# Patient Record
Sex: Male | Born: 1990 | Race: Black or African American | Hispanic: No | Marital: Married | Smoking: Never smoker
Health system: Southern US, Community
[De-identification: ages and names within clinical notes are randomized; demographics above are authoritative.]

## PROBLEM LIST (undated history)

## (undated) ENCOUNTER — Ambulatory Visit (HOSPITAL_COMMUNITY): Admission: EM | Payer: Self-pay | Source: Home / Self Care

---

## 2017-12-22 ENCOUNTER — Other Ambulatory Visit: Payer: Self-pay

## 2017-12-22 ENCOUNTER — Emergency Department (HOSPITAL_COMMUNITY): Payer: BLUE CROSS/BLUE SHIELD

## 2017-12-22 ENCOUNTER — Emergency Department (HOSPITAL_COMMUNITY)
Admission: EM | Admit: 2017-12-22 | Discharge: 2017-12-22 | Disposition: A | Discharge status: Home / Self Care | Payer: BLUE CROSS/BLUE SHIELD | Attending: Emergency Medicine | Admitting: Emergency Medicine

## 2017-12-22 ENCOUNTER — Encounter (HOSPITAL_COMMUNITY): Payer: Self-pay | Admitting: Emergency Medicine

## 2017-12-22 DIAGNOSIS — G44209 Tension-type headache, unspecified, not intractable: Secondary | ICD-10-CM

## 2017-12-22 DIAGNOSIS — G43909 Migraine, unspecified, not intractable, without status migrainosus: Secondary | ICD-10-CM | POA: Diagnosis present

## 2017-12-22 LAB — GROUP A STREP BY PCR: Group A Strep by PCR: NOT DETECTED

## 2017-12-22 MED ORDER — METOCLOPRAMIDE HCL 5 MG/ML IJ SOLN
INTRAMUSCULAR | Status: AC
  Filled 2017-12-22: qty 2

## 2017-12-22 MED ORDER — IBUPROFEN 600 MG PO TABS: 600.0000 mg | ORAL_TABLET | Freq: Four times a day (QID) | ORAL | 0 refills | Status: DC | PRN

## 2017-12-22 MED ORDER — METHOCARBAMOL 500 MG PO TABS: 500.0000 mg | ORAL_TABLET | Freq: Two times a day (BID) | ORAL | 0 refills | Status: DC

## 2017-12-22 MED ORDER — KETOROLAC TROMETHAMINE 30 MG/ML IJ SOLN: 30.0000 mg | Freq: Once | INTRAMUSCULAR | Status: DC

## 2017-12-22 MED ORDER — METOCLOPRAMIDE HCL 5 MG/ML IJ SOLN: 10.0000 mg | Freq: Once | INTRAMUSCULAR | Status: DC

## 2017-12-22 MED ORDER — SODIUM CHLORIDE 0.9 % IV BOLUS: 1000.0000 mL | Freq: Once | INTRAVENOUS | Status: DC

## 2017-12-22 MED ORDER — METOCLOPRAMIDE HCL 10 MG PO TABS
10.0000 mg | ORAL_TABLET | Freq: Once | ORAL | Status: AC
  Administered 2017-12-22: 10 mg via ORAL
  Filled 2017-12-22: qty 1

## 2017-12-22 MED ORDER — KETOROLAC TROMETHAMINE 60 MG/2ML IM SOLN: 30.0000 mg | Freq: Once | INTRAMUSCULAR | Status: DC

## 2017-12-22 MED ORDER — KETOROLAC TROMETHAMINE 30 MG/ML IJ SOLN
INTRAMUSCULAR | Status: AC
  Filled 2017-12-22: qty 1

## 2017-12-22 NOTE — ED Notes (Signed)
Pt refused IV  

## 2017-12-22 NOTE — ED Triage Notes (Signed)
Pt reports migraine since Friday and has not resolved since then. Pt denies any nausea or vomiting.

## 2017-12-22 NOTE — Discharge Instructions (Signed)
Take ibuprofen as prescribed, as needed for your headache and neck pain.  Take Robaxin twice daily as needed for muscle pain or spasms.  Do not drive or operate machinery while taking this medication.  Use ice and heat alternating 20 minutes on, 20 minutes off.  Attempt the stretches that we discussed a few times daily.  Please return the emergency department if you develop any new or worsening symptoms.

## 2017-12-22 NOTE — ED Provider Notes (Signed)
[facility]   CSN: [id number] Arrival date & time: 12/22/17  0009     History   Chief Complaint Chief Complaint  Patient presents with  . Migraine    HPI Jeremy Mccarthy is a 27 y.o. male who is previously healthy who presents with a 3-day history of intermittent headache.  He describes it as temporal and sharp and stabbing.  He reports some pain in his neck as well.  He denies any injury, but does lift weights.  He reports he has had some nasal congestion at home and had chills one day, but no fever.  His chills are resolved.  He took ibuprofen and Tylenol at home without significant relief.  He has had some photosensitivity.  He denies any nausea or vomiting, chest pain, shortness of breath, abdominal pain, numbness or tingling.  HPI  History reviewed. No pertinent past medical history.  There are no active problems to display for this patient.   History reviewed. No pertinent surgical history.      Home Medications    Prior to Admission medications   Medication Sig Start Date End Date Taking? Authorizing Provider  acetaminophen (TYLENOL) 500 MG tablet Take 500 mg by mouth every 6 (six) hours as needed for headache.   Yes [provider]  ibuprofen (ADVIL,MOTRIN) 600 MG tablet Take 1 tablet (600 mg total) by mouth every 6 (six) hours as needed. 12/22/17   Boleslaw Borghi, Waylan Boga, PA-C  methocarbamol (ROBAXIN) 500 MG tablet Take 1 tablet (500 mg total) by mouth 2 (two) times daily. 12/22/17   Emi Holes, PA-C    Family History History reviewed. No pertinent family history.  Social History Social History   Tobacco Use  . Smoking status: Never Smoker  . Smokeless tobacco: Never Used  Substance Use Topics  . Alcohol use: Never    Frequency: Never  . Drug use: Never     Allergies   Patient has no known allergies.   Review of Systems Review of Systems  Constitutional: Positive for chills (resolved, only  1 day). Negative for fever.  HENT: Positive for congestion and rhinorrhea. Negative for facial swelling and sore throat.   Respiratory: Negative for shortness of breath.   Cardiovascular: Negative for chest pain.  Gastrointestinal: Negative for abdominal pain, nausea and vomiting.  Genitourinary: Negative for dysuria.  Musculoskeletal: Positive for neck pain. Negative for back pain and neck stiffness.  Skin: Negative for rash and wound.  Neurological: Positive for headaches.  Psychiatric/Behavioral: The patient is not nervous/anxious.      Physical Exam Updated Vital Signs BP 127/77   Pulse 70   Temp 99.1 F (37.3 C) (Oral)   Resp 14   Ht 5\' 6"  (1.676 m)   Wt 63.5 kg (140 lb)   SpO2 100%   BMI 22.60 kg/m   Physical Exam  Constitutional: He appears well-developed and well-nourished. No distress.  HENT:  Head: Normocephalic and atraumatic.  Mouth/Throat: Oropharynx is clear and moist. No oropharyngeal exudate.  Eyes: Pupils are equal, round, and reactive to light. Conjunctivae and EOM are normal. Right eye exhibits no discharge. Left eye exhibits no discharge. No scleral icterus.  Neck: Normal range of motion. Neck supple. No thyromegaly present.  Patient with full range of motion to his neck without difficulty, he reports bilateral upper trapezius tenderness when he moves his neck down and is also reproduced on palpation No midline tenderness or pain  Cardiovascular: Normal rate, regular rhythm, normal  heart sounds and intact distal pulses. Exam reveals no gallop and no friction rub.  No murmur heard. Pulmonary/Chest: Effort normal and breath sounds normal. No stridor. No respiratory distress. He has no wheezes. He has no rales.  Abdominal: Soft. Bowel sounds are normal. He exhibits no distension. There is no tenderness. There is no rebound and no guarding.  Musculoskeletal: He exhibits no edema.  Lymphadenopathy:    He has no cervical adenopathy.  Neurological: He is alert.  Coordination normal.  CN 3-12 intact; normal sensation throughout; 5/5 strength in all 4 extremities; equal bilateral grip strength; no ataxia on finger-to-nose  Skin: Skin is warm and dry. No rash noted. He is not diaphoretic. No pallor.  Psychiatric: He has a normal mood and affect.  Nursing note and vitals reviewed.    ED Treatments / Results  Labs (all labs ordered are listed, but only abnormal results are displayed) Labs Reviewed  GROUP A STREP BY PCR    EKG None  Radiology Ct Head Wo Contrast  Result Date: 12/22/2017 CLINICAL DATA:  Migraine headache EXAM: CT HEAD WITHOUT CONTRAST TECHNIQUE: Contiguous axial images were obtained from the base of the skull through the vertex without intravenous contrast. COMPARISON:  None. FINDINGS: Brain: No evidence of acute infarction, hemorrhage, hydrocephalus, extra-axial collection or mass lesion/mass effect. Vascular: No hyperdense vessel or unexpected calcification. Skull: Normal. Negative for fracture or focal lesion. Sinuses/Orbits: The visualized paranasal sinuses are essentially clear. The mastoid air cells are unopacified. Other: None. IMPRESSION: Normal head CT. Electronically Signed   By: Charline BillsSriyesh  Krishnan M.D.   On: 12/22/2017 02:45    Procedures Procedures (including critical care time)  Medications Ordered in ED Medications  ketorolac (TORADOL) injection 30 mg (30 mg Intramuscular Refused 12/22/17 0335)  metoCLOPramide (REGLAN) tablet 10 mg (10 mg Oral Given 12/22/17 0334)     Initial Impression / Assessment and Plan / ED Course  I have reviewed the triage vital signs and the nursing notes.  Pertinent labs & imaging results that were available during my care of the patient were reviewed by me and considered in my medical decision making (see chart for details).     Patient with suspected tension headache.  Patient has full range of motion of his neck.  He is afebrile.  Normal neuro exam without focal deficits. Patient  offered headache cocktail in the ED, however he was afraid of needles and did not want an IV or IM medications.  Patient given Reglan instead.  He is now asymptomatic.  He is very concerned about his headaches considering no history and requested CT of the head which I feel is reasonable considering patient had no history of headaches.  CT head was negative.  Strep negative.  Patient advised to take ibuprofen and Robaxin at home as needed for muscle pain and spasms.  Discussed stretches and heat and ice therapy as well.  Strict return precautions discussed.  Patient understands and agrees with plan.  Patient vitals stable throughout ED course and discharged in satisfactory condition.  Final Clinical Impressions(s) / ED Diagnoses   Final diagnoses:  Tension headache    ED Discharge Orders        Ordered    ibuprofen (ADVIL,MOTRIN) 600 MG tablet  Every 6 hours PRN     12/22/17 0403    methocarbamol (ROBAXIN) 500 MG tablet  2 times daily     12/22/17 0403       Ascencion Coye, Waylan BogaAlexandra M, PA-C 12/22/17 0408    Rhunette CroftNanavati,  Ankit, MD 12/23/17 [phone]

## 2019-03-20 IMAGING — CT CT HEAD W/O CM
3 series · 16 of 47 positions shown, 19 images · non-contrast
Comparison: None.

CLINICAL DATA: Migraine headache

EXAM:
CT HEAD WITHOUT CONTRAST
TECHNIQUE: Contiguous axial images were obtained from the base of the skull
through the vertex without intravenous contrast.

[Series 3: head wo · axial · 0.47mm/px · z∈[+186,+321]mm · 10 of 33 slices shown, 13 images]
[im 3/33  brain]
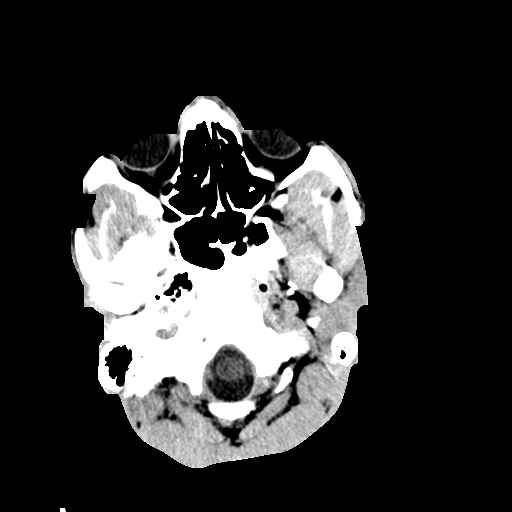
[im 3/33  bone]
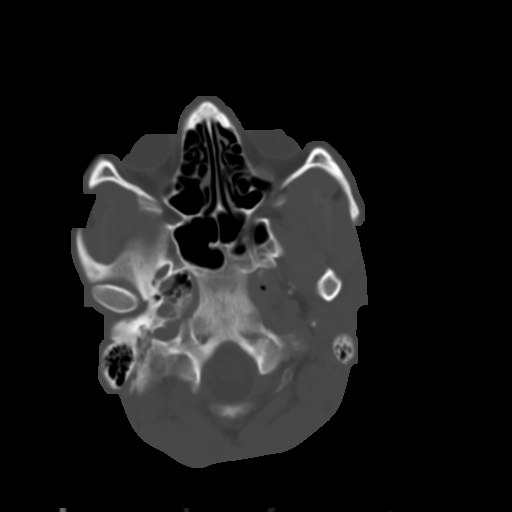
[im 6/33  brain]
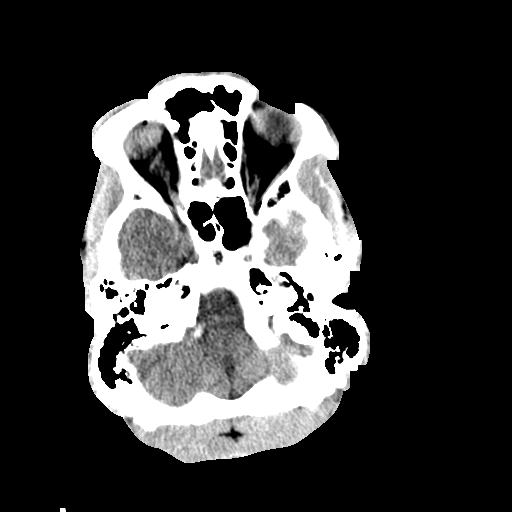
[im 9/33  brain]
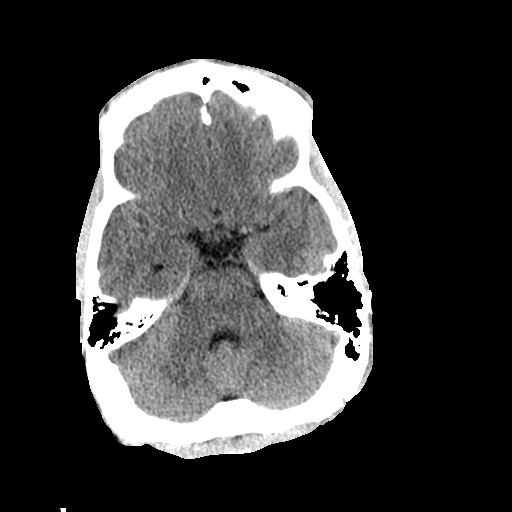
[im 12/33  brain]
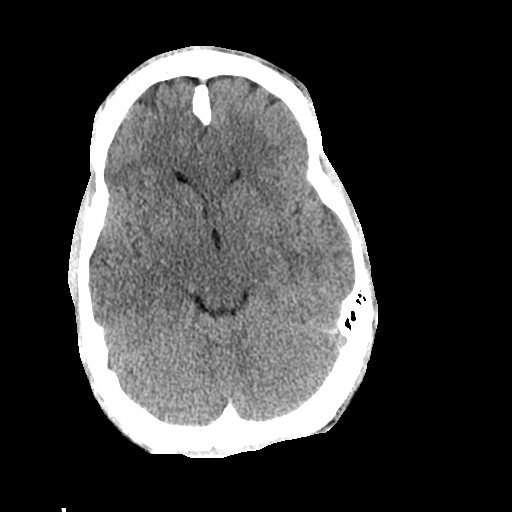
[im 15/33  brain]
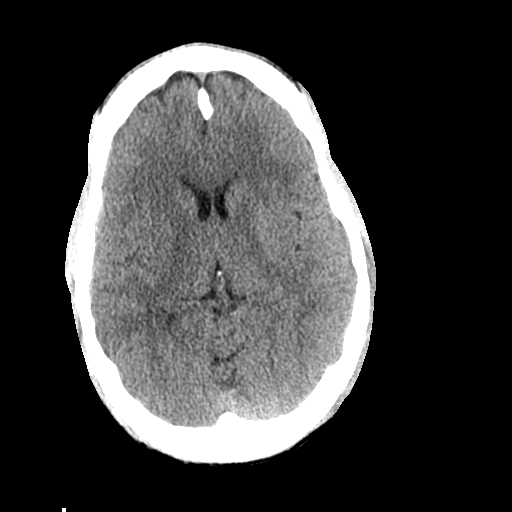
[im 15/33  bone]
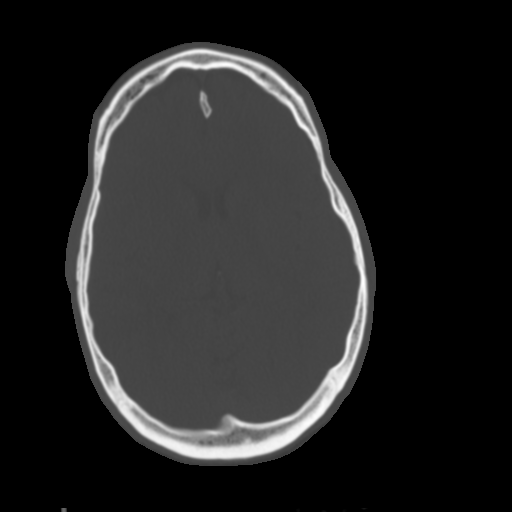
[im 18/33  brain]
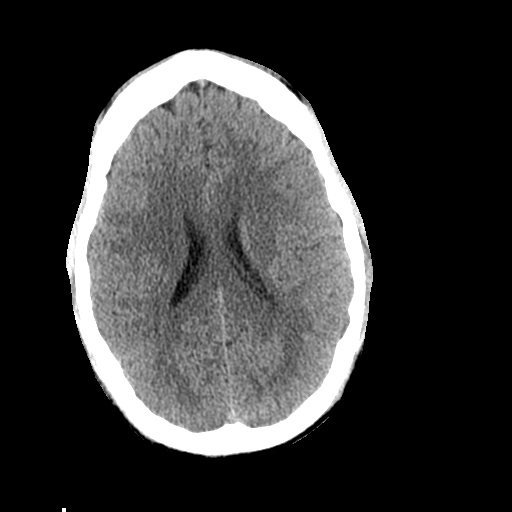
[im 21/33  brain]
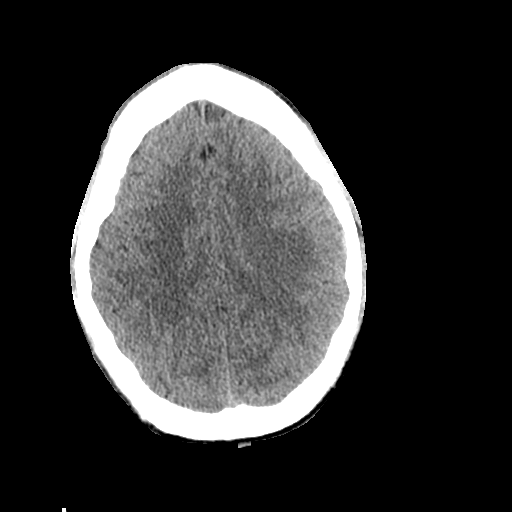
[im 25/33  brain]
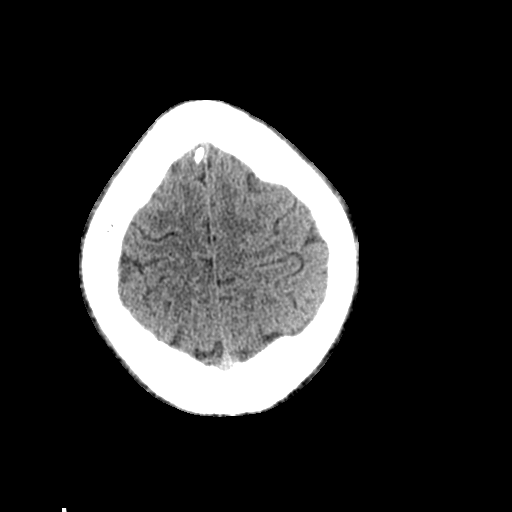
[im 27/33  brain]
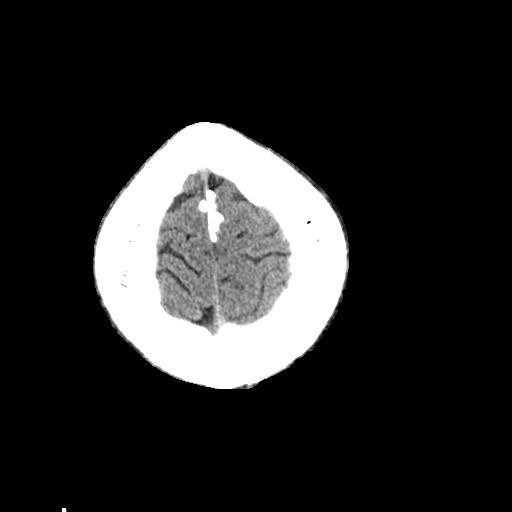
[im 27/33  bone]
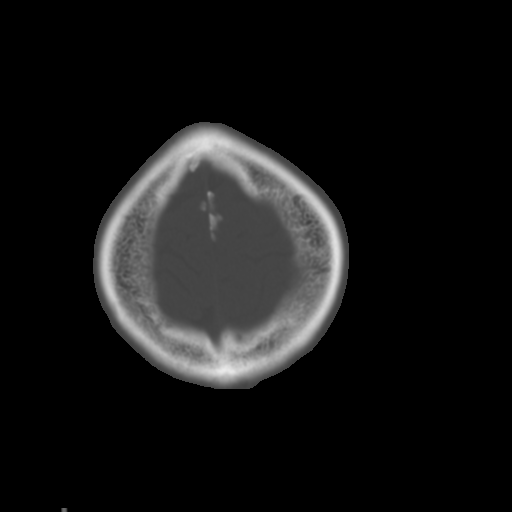
[im 30/33  brain]
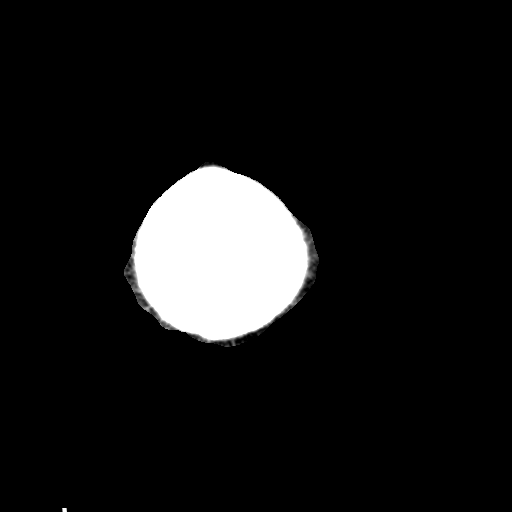

[Series 5: coronal soft tissue · coronal · 0.30mm/px · 3 of 64 slices shown]
[im 22/64  brain]
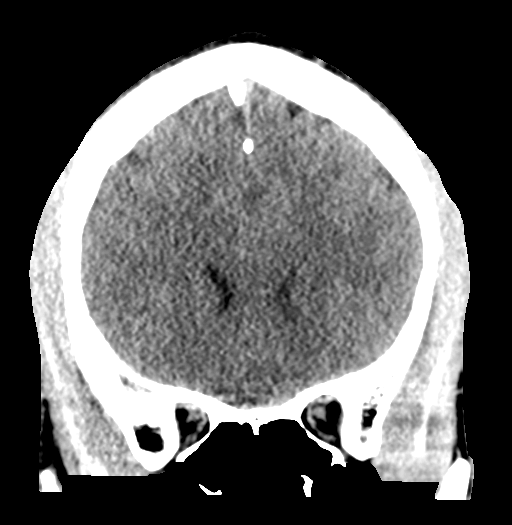
[im 29/64  brain]
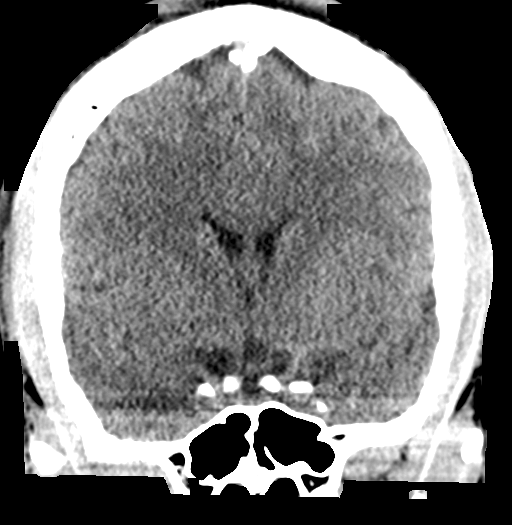
[im 36/64  brain]
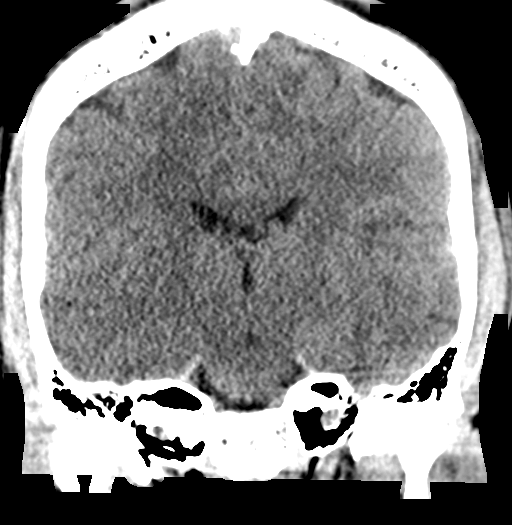

[Series 6: sagittal soft tissue · sagittal · 0.33mm/px · 3 of 50 slices shown]
[im 17/50  brain]
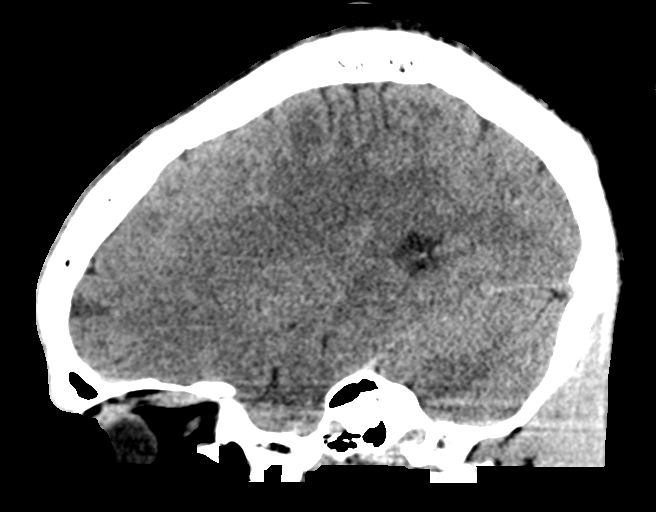
[im 25/50  brain]
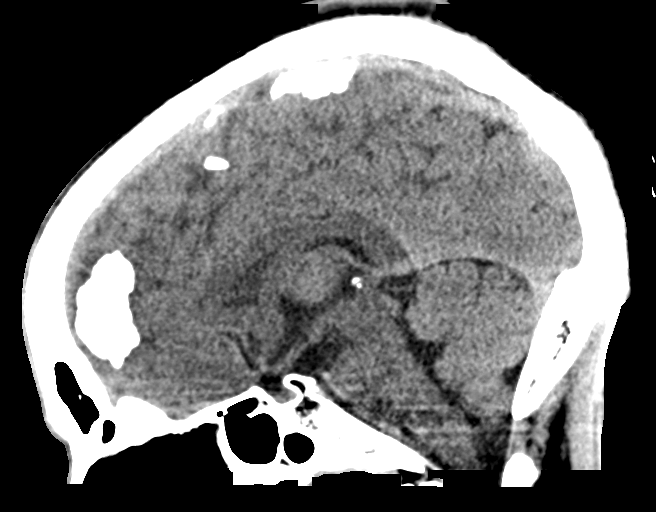
[im 33/50  brain]
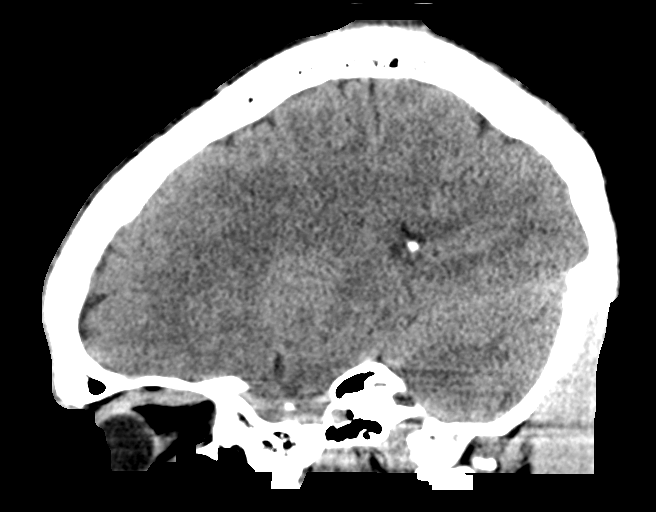

[16 of 47 positions shown; findings below may reference images not displayed]

FINDINGS: Brain: No evidence of acute infarction, hemorrhage, hydrocephalus,
extra-axial collection or mass lesion/mass effect.

Vascular: No hyperdense vessel or unexpected calcification.

Skull: Normal. Negative for fracture or focal lesion.

Sinuses/Orbits: The visualized paranasal sinuses are essentially
clear. The mastoid air cells are unopacified.

Other: None.
IMPRESSION: Normal head CT.

## 2019-09-02 ENCOUNTER — Encounter (HOSPITAL_COMMUNITY): Payer: Self-pay

## 2019-09-02 ENCOUNTER — Ambulatory Visit (HOSPITAL_COMMUNITY)
Admission: EM | Admit: 2019-09-02 | Discharge: 2019-09-02 | Disposition: A | Discharge status: Home / Self Care | Payer: Self-pay

## 2019-09-02 ENCOUNTER — Other Ambulatory Visit: Payer: Self-pay

## 2019-09-02 DIAGNOSIS — M79645 Pain in left finger(s): Secondary | ICD-10-CM

## 2019-09-02 MED ORDER — DICLOFENAC SODIUM 1 % EX GEL: 4.0000 g | Freq: Four times a day (QID) | CUTANEOUS | 0 refills | Status: DC

## 2019-09-02 NOTE — ED Provider Notes (Signed)
[facility]    CSN: [id number] Arrival date & time: 09/02/19  1927      History   Chief Complaint Chief Complaint  Patient presents with  . Hand Pain    HPI Jeremy Mccarthy is a 29 y.o. male.   Patient reports for evaluation of pain in his left middle finger.  Reports is been present for several years.  He reports he believes he injured it playing baseball sliding a base of years ago.  Never had evaluated the time and mostly healed.  He reports it gives him issues time to time.  Reports the pain is at the first knuckle in the middle finger on the left hand.  Denies significant swelling, redness or rash.  Reports he can use the hand regularly however he noted today when he was carrying a bag that he had pain on the inside of the finger.  Denies recent trauma.  Denies other joint pain.     History reviewed. No pertinent past medical history.  There are no problems to display for this patient.   History reviewed. No pertinent surgical history.     Home Medications    Prior to Admission medications   Medication Sig Start Date End Date Taking? Authorizing Provider  Multiple Vitamin (MULTIVITAMIN WITH MINERALS) TABS tablet Take 1 tablet by mouth daily.   Yes [provider]  acetaminophen (TYLENOL) 500 MG tablet Take 500 mg by mouth every 6 (six) hours as needed for headache.    [provider]  diclofenac Sodium (VOLTAREN) 1 % GEL Apply 4 g topically 4 (four) times daily. 09/02/19   Jeremy Mccarthy, Jeremy Speak, PA-C  ibuprofen (ADVIL,MOTRIN) 600 MG tablet Take 1 tablet (600 mg total) by mouth every 6 (six) hours as needed. 12/22/17   Law, Waylan Boga, PA-C  methocarbamol (ROBAXIN) 500 MG tablet Take 1 tablet (500 mg total) by mouth 2 (two) times daily. 12/22/17   Jeremy Holes, PA-C    Family History Family History  Problem Relation Age of Onset  . Diabetes Mother     Social History Social History   Tobacco Use  . Smoking status: Never Smoker  .  Smokeless tobacco: Never Used  Substance Use Topics  . Alcohol use: Never  . Drug use: Never     Allergies   Patient has no known allergies.   Review of Systems Review of Systems   Physical Exam Triage Vital Signs ED Triage Vitals [09/02/19 2031]  Enc Vitals Group     BP (!) 117/54     Pulse Rate 67     Resp 16     Temp 98.8 F (37.1 C)     Temp Source Oral     SpO2 100 %     Weight 140 lb (63.5 kg)     Height 5\' 6"  (1.676 m)     Head Circumference      Peak Flow      Pain Score 5     Pain Loc      Pain Edu?      Excl. in GC?    No data found.  Updated Vital Signs BP (!) 117/54   Pulse 67   Temp 98.8 F (37.1 C) (Oral)   Resp 16   Ht 5\' 6"  (1.676 m)   Wt 140 lb (63.5 kg)   SpO2 100%   BMI 22.60 kg/m   Visual Acuity Right Eye Distance:   Left Eye Distance:   Bilateral Distance:  Right Eye Near:   Left Eye Near:    Bilateral Near:     Physical Exam Constitutional:      Appearance: Normal appearance.  Musculoskeletal:     Comments: Left hand: Third digit without erythema, swelling, crepitus.  Patient has full range of motion.  Grip strength 5/5.  Joints not swollen.  Neurological:     Mental Status: He is alert.      UC Treatments / Results  Labs (all labs ordered are listed, but only abnormal results are displayed) Labs Reviewed - No data to display  EKG   Radiology No results found.  Procedures Procedures (including critical care time)  Medications Ordered in UC Medications - No data to display  Initial Impression / Assessment and Plan / UC Course  I have reviewed the triage vital signs and the nursing notes.  Pertinent labs & imaging results that were available during my care of the patient were reviewed by me and considered in my medical decision making (see chart for details).     #Finger pain 29 year old male presenting with chronic left ring finger pain.  No obvious sign of injury or inflammatory process.  Possibly  early osteoarthritis secondary to previous injuries.  Discussed this with patient that there is no dictation for imaging today.  Recommended starting Voltaren gel 4 times a day as needed in order to avoid systemic NSAIDs.  Patient verbalizes agreement with this plan and will follow up with primary care if continued to cause issues. Final Clinical Impressions(s) / UC Diagnoses   Final diagnoses:  Finger pain, left     Discharge Instructions     Try the voltaren, 4 times a day .  If it continues to bother you, add ibuprofen 400mg  up to every 6 hours  Please follow up with primary care for further evaluation.        ED Prescriptions    Medication Sig Dispense Auth. Provider   diclofenac Sodium (VOLTAREN) 1 % GEL Apply 4 g topically 4 (four) times daily. 100 g Jeremy Mccarthy, Jeremy Beards, PA-C     PDMP not reviewed this encounter.   Purnell Shoemaker, PA-C 09/03/19 1729

## 2019-09-02 NOTE — ED Triage Notes (Signed)
Pt states his 3rd left digit has been hurting for years now, but got worse within the past month. Pt states he doesn't remember injuring finger. No edema noted. Pt is able to fully bend finger.

## 2019-09-02 NOTE — Discharge Instructions (Addendum)
Try the voltaren, 4 times a day .  If it continues to bother you, add ibuprofen 400mg  up to every 6 hours  Please follow up with primary care for further evaluation.

## 2022-01-28 ENCOUNTER — Encounter (HOSPITAL_COMMUNITY): Payer: Self-pay | Admitting: Emergency Medicine

## 2022-01-28 ENCOUNTER — Other Ambulatory Visit: Payer: Self-pay

## 2022-01-28 ENCOUNTER — Emergency Department (HOSPITAL_COMMUNITY)
Admission: EM | Admit: 2022-01-28 | Discharge: 2022-01-29 | Disposition: A | Discharge status: Home / Self Care | Payer: 59 | Attending: Emergency Medicine | Admitting: Emergency Medicine

## 2022-01-28 DIAGNOSIS — R6884 Jaw pain: Secondary | ICD-10-CM | POA: Diagnosis present

## 2022-01-28 DIAGNOSIS — Z5321 Procedure and treatment not carried out due to patient leaving prior to being seen by health care provider: Secondary | ICD-10-CM | POA: Diagnosis not present

## 2022-01-28 DIAGNOSIS — K0889 Other specified disorders of teeth and supporting structures: Secondary | ICD-10-CM | POA: Diagnosis not present

## 2022-01-28 NOTE — ED Triage Notes (Signed)
Patient reports mouth pain for several days , denies injury , airway intact/ respirations unlabored .

## 2022-01-29 ENCOUNTER — Emergency Department (HOSPITAL_COMMUNITY)
Admission: EM | Admit: 2022-01-29 | Discharge: 2022-01-29 | Disposition: A | Discharge status: Home / Self Care | Payer: 59 | Attending: Emergency Medicine | Admitting: Emergency Medicine

## 2022-01-29 ENCOUNTER — Other Ambulatory Visit: Payer: Self-pay

## 2022-01-29 ENCOUNTER — Encounter (HOSPITAL_COMMUNITY): Payer: Self-pay

## 2022-01-29 DIAGNOSIS — K029 Dental caries, unspecified: Secondary | ICD-10-CM | POA: Insufficient documentation

## 2022-01-29 DIAGNOSIS — K0889 Other specified disorders of teeth and supporting structures: Secondary | ICD-10-CM | POA: Diagnosis present

## 2022-01-29 MED ORDER — NAPROXEN 500 MG PO TABS: 500.0000 mg | ORAL_TABLET | Freq: Two times a day (BID) | ORAL | 0 refills | Status: DC

## 2022-01-29 MED ORDER — AMOXICILLIN 500 MG PO CAPS
500.0000 mg | ORAL_CAPSULE | Freq: Once | ORAL | Status: AC
  Administered 2022-01-29: 500 mg via ORAL
  Filled 2022-01-29: qty 1

## 2022-01-29 MED ORDER — AMOXICILLIN 500 MG PO CAPS: 500.0000 mg | ORAL_CAPSULE | Freq: Three times a day (TID) | ORAL | 0 refills | Status: AC

## 2022-01-29 MED ORDER — NAPROXEN 500 MG PO TABS
500.0000 mg | ORAL_TABLET | Freq: Once | ORAL | Status: AC
  Administered 2022-01-29: 500 mg via ORAL
  Filled 2022-01-29: qty 1

## 2022-01-29 NOTE — ED Triage Notes (Addendum)
Pt states that he has a toothache to his top and bottom teeth.(x 2 weeks) Pt states that he thinks that he has a mouth infection and this is what is causing a headache. Pt states that he has a dentist appt tomorrow but the pain is unbearable.

## 2022-01-29 NOTE — ED Provider Notes (Signed)
[facility] Provider Note   CSN: [id number] Arrival date & time: 01/29/22  2002    History  Chief Complaint  Patient presents with   Dental Pain    Jeremy Mccarthy is a 31 y.o. male with no significant past medical history here for evaluation of dental pain. Began 2 weeks ago. Primarily located to right lower dentition. Worse with eating or drink hot or cold food. Took Ibuprofen yesterday which helped. Has dental appointment tomorrow. Something similar and was started on abx which helped. No fever, facial swelling, neck pain, stiffness. Tolerating PO intake without difficulty.  HPI     Home Medications Prior to Admission medications   Medication Sig Start Date End Date Taking? Authorizing Provider  amoxicillin (AMOXIL) 500 MG capsule Take 1 capsule (500 mg total) by mouth 3 (three) times daily for 7 days. 01/29/22 02/05/22 Yes Zaia Carre A, PA-C  naproxen (NAPROSYN) 500 MG tablet Take 1 tablet (500 mg total) by mouth 2 (two) times daily. 01/29/22  Yes Aaralyn Kil A, PA-C  acetaminophen (TYLENOL) 500 MG tablet Take 500 mg by mouth every 6 (six) hours as needed for headache.    [provider]  diclofenac Sodium (VOLTAREN) 1 % GEL Apply 4 g topically 4 (four) times daily. 09/02/19   Darr, Gerilyn Pilgrim, PA-C  ibuprofen (ADVIL,MOTRIN) 600 MG tablet Take 1 tablet (600 mg total) by mouth every 6 (six) hours as needed. 12/22/17   Law, Waylan Boga, PA-C  methocarbamol (ROBAXIN) 500 MG tablet Take 1 tablet (500 mg total) by mouth 2 (two) times daily. 12/22/17   Emi Holes, PA-C  Multiple Vitamin (MULTIVITAMIN WITH MINERALS) TABS tablet Take 1 tablet by mouth daily.    [provider]      Allergies    Patient has no known allergies.    Review of Systems   Review of Systems  Constitutional: Negative.   HENT:  Positive for dental problem.   Respiratory: Negative.    Cardiovascular: Negative.   Gastrointestinal: Negative.    Genitourinary: Negative.   Musculoskeletal: Negative.   Skin: Negative.   Neurological: Negative.   All other systems reviewed and are negative.   Physical Exam Updated Vital Signs BP 124/88 (BP Location: Right Arm)   Pulse 62   Temp 98.1 F (36.7 C) (Oral)   Resp 14   Ht 5\' 6"  (1.676 m)   Wt 62.1 kg   SpO2 100%   BMI 22.11 kg/m  Physical Exam Vitals and nursing note reviewed.  Constitutional:      General: He is not in acute distress.    Appearance: He is well-developed. He is not ill-appearing or diaphoretic.  HENT:     Head: Atraumatic.     Jaw: There is normal jaw occlusion.     Comments: No facial swelling, no drooling, dysphagia or trismus    Mouth/Throat:     Lips: Pink.     Dentition: Abnormal dentition. Does not have dentures. Dental tenderness and dental caries present. No gingival swelling or dental abscesses.     Palate: No mass and lesions.     Pharynx: Oropharynx is clear. Uvula midline.     Tonsils: No tonsillar exudate or tonsillar abscesses.     Comments: Poor dentition, some missing teeth, dental caries.  Sublingual area soft.  No obvious drainable drainable.  Eyes:     Pupils: Pupils are equal, round, and reactive to light.  Neck:     Trachea: Trachea and phonation normal.  Comments: Full ROM Cardiovascular:     Rate and Rhythm: Normal rate and regular rhythm.  Pulmonary:     Effort: Pulmonary effort is normal. No respiratory distress.  Abdominal:     General: There is no distension.     Palpations: Abdomen is soft.  Musculoskeletal:        General: Normal range of motion.     Cervical back: Full passive range of motion without pain, normal range of motion and neck supple.  Skin:    General: Skin is warm and dry.  Neurological:     General: No focal deficit present.     Mental Status: He is alert and oriented to person, place, and time.    ED Results / Procedures / Treatments   Labs (all labs ordered are listed, but only abnormal  results are displayed) Labs Reviewed - No data to display  EKG None  Radiology No results found.  Procedures Procedures    Medications Ordered in ED Medications  amoxicillin (AMOXIL) capsule 500 mg (has no administration in time range)  naproxen (NAPROSYN) tablet 500 mg (has no administration in time range)    ED Course/ Medical Decision Making/ A&P    31 year old here for evaluation of dental pain over the last 2 weeks.  His dental appointment tomorrow.  No recent trauma.  Pain worse with hot or cold liquids.  Took ibuprofen at home which helped with this pain.  He has no obvious facial swelling.  Low suspicion for Ludwig's angina, deep space infection.  Posterior pharynx clear.  Does have some dental caries, missing teeth however no obvious oral abscess.  Start on antibiotics, anti-inflammatory, encouraged him to keep his dental appointment for tomorrow, return for new worsening symptoms. Tolerating PO intake without difficulty.  The patient has been appropriately medically screened and/or stabilized in the ED. I have low suspicion for any other emergent medical condition which would require further screening, evaluation or treatment in the ED or require inpatient management.  Patient is hemodynamically stable and in no acute distress.  Patient able to ambulate in department prior to ED.  Evaluation does not show acute pathology that would require ongoing or additional emergent interventions while in the emergency department or further inpatient treatment.  I have discussed the diagnosis with the patient and answered all questions.  Pain is been managed while in the emergency department and patient has no further complaints prior to discharge.  Patient is comfortable with plan discussed in room and is stable for discharge at this time.  I have discussed strict return precautions for returning to the emergency department.  Patient was encouraged to follow-up with PCP/specialist refer to at  discharge.                           Medical Decision Making Amount and/or Complexity of Data Reviewed External Data Reviewed: notes.  Risk OTC drugs. Prescription drug management. Decision regarding hospitalization. Diagnosis or treatment significantly limited by social determinants of health.          Final Clinical Impression(s) / ED Diagnoses Final diagnoses:  Pain, dental    Rx / DC Orders ED Discharge Orders          Ordered    naproxen (NAPROSYN) 500 MG tablet  2 times daily        01/29/22 2113    amoxicillin (AMOXIL) 500 MG capsule  3 times daily        01/29/22  2113              Myalynn Lingle A, PA-C 01/29/22 2114    Pricilla Loveless, MD 02/01/22 [phone]

## 2022-01-29 NOTE — Discharge Instructions (Signed)
Take the medication as prescribed.  Follow-up with dentistry  Return for new or worsening symptoms

## 2022-01-29 NOTE — ED Notes (Signed)
This Clinical research associate obtained vitals on everyone in lobby. Patient was not present. No answer for vitals recheck either.

## 2022-12-08 ENCOUNTER — Encounter (HOSPITAL_COMMUNITY): Payer: Self-pay

## 2022-12-08 ENCOUNTER — Emergency Department (HOSPITAL_COMMUNITY)
Admission: EM | Admit: 2022-12-08 | Discharge: 2022-12-08 | Disposition: A | Discharge status: Home / Self Care | Payer: BC Managed Care – PPO | Attending: Emergency Medicine | Admitting: Emergency Medicine

## 2022-12-08 ENCOUNTER — Emergency Department (HOSPITAL_COMMUNITY): Payer: BC Managed Care – PPO

## 2022-12-08 ENCOUNTER — Other Ambulatory Visit: Payer: Self-pay

## 2022-12-08 DIAGNOSIS — M25512 Pain in left shoulder: Secondary | ICD-10-CM | POA: Diagnosis not present

## 2022-12-08 DIAGNOSIS — Z041 Encounter for examination and observation following transport accident: Secondary | ICD-10-CM | POA: Diagnosis not present

## 2022-12-08 DIAGNOSIS — M542 Cervicalgia: Secondary | ICD-10-CM | POA: Diagnosis not present

## 2022-12-08 DIAGNOSIS — Y92410 Unspecified street and highway as the place of occurrence of the external cause: Secondary | ICD-10-CM | POA: Diagnosis not present

## 2022-12-08 DIAGNOSIS — Z23 Encounter for immunization: Secondary | ICD-10-CM | POA: Insufficient documentation

## 2022-12-08 MED ORDER — IBUPROFEN 200 MG PO TABS
400.0000 mg | ORAL_TABLET | Freq: Once | ORAL | Status: AC
  Administered 2022-12-08: 400 mg via ORAL
  Filled 2022-12-08: qty 2

## 2022-12-08 MED ORDER — TETANUS-DIPHTH-ACELL PERTUSSIS 5-2.5-18.5 LF-MCG/0.5 IM SUSY
0.5000 mL | PREFILLED_SYRINGE | Freq: Once | INTRAMUSCULAR | Status: AC
  Administered 2022-12-08: 0.5 mL via INTRAMUSCULAR
  Filled 2022-12-08: qty 0.5
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
## 2022-12-09 ENCOUNTER — Other Ambulatory Visit: Payer: Self-pay

## 2022-12-09 ENCOUNTER — Emergency Department (HOSPITAL_COMMUNITY)
Admission: EM | Admit: 2022-12-09 | Discharge: 2022-12-09 | Disposition: A | Discharge status: Home / Self Care | Payer: BC Managed Care – PPO | Attending: Emergency Medicine | Admitting: Emergency Medicine

## 2022-12-09 DIAGNOSIS — M542 Cervicalgia: Secondary | ICD-10-CM | POA: Diagnosis not present

## 2022-12-09 DIAGNOSIS — M546 Pain in thoracic spine: Secondary | ICD-10-CM | POA: Diagnosis not present

## 2022-12-09 DIAGNOSIS — M25512 Pain in left shoulder: Secondary | ICD-10-CM | POA: Diagnosis not present

## 2022-12-09 DIAGNOSIS — M7918 Myalgia, other site: Secondary | ICD-10-CM

## 2022-12-09 DIAGNOSIS — M25511 Pain in right shoulder: Secondary | ICD-10-CM | POA: Insufficient documentation

## 2022-12-09 DIAGNOSIS — Y92410 Unspecified street and highway as the place of occurrence of the external cause: Secondary | ICD-10-CM | POA: Insufficient documentation

## 2022-12-09 DIAGNOSIS — M791 Myalgia, unspecified site: Secondary | ICD-10-CM | POA: Diagnosis not present

## 2022-12-09 MED ORDER — IBUPROFEN 800 MG PO TABS: 800.0000 mg | ORAL_TABLET | Freq: Three times a day (TID) | ORAL | 0 refills | Status: DC

## 2022-12-09 MED ORDER — CYCLOBENZAPRINE HCL 10 MG PO TABS: 10.0000 mg | ORAL_TABLET | Freq: Every day | ORAL | 0 refills | Status: AC

## 2022-12-09 MED ORDER — CYCLOBENZAPRINE HCL 10 MG PO TABS
10.0000 mg | ORAL_TABLET | Freq: Once | ORAL | Status: AC
  Administered 2022-12-09: 10 mg via ORAL
  Filled 2022-12-09: qty 1

## 2022-12-09 NOTE — Discharge Instructions (Addendum)
As discussed, your pain is most likely a result of musculoskeletal pain. Take Ibuprofen 800 MG every 8 hours as needed for pain and inflammation. Take Flexeril once a day for muscle spasms and stiffness. Do not drive or operate heavy machinery while taking this medication. Utilize RICE therapy, information provided.  I have sent a referral to [facility] so you can get established with a primary care provider. They should be calling you in several days to make an appointment.   Get help right away if: You have a new injury and your pain is worse or different. You feel numb or you have tingling in the painful area.
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
## 2022-12-23 ENCOUNTER — Encounter: Payer: Self-pay | Admitting: Internal Medicine

## 2022-12-23 ENCOUNTER — Ambulatory Visit (INDEPENDENT_AMBULATORY_CARE_PROVIDER_SITE_OTHER): Payer: BC Managed Care – PPO | Admitting: Internal Medicine

## 2022-12-23 VITALS — BP 98/64 | HR 69 | Temp 98.2°F | Ht 67.0 in | Wt 143.8 lb

## 2022-12-23 DIAGNOSIS — M542 Cervicalgia: Secondary | ICD-10-CM | POA: Diagnosis not present

## 2022-12-23 MED ORDER — IBUPROFEN 800 MG PO TABS: 800.0000 mg | ORAL_TABLET | Freq: Three times a day (TID) | ORAL | 0 refills | Status: DC | PRN

## 2022-12-23 MED ORDER — METHOCARBAMOL 750 MG PO TABS: 750.0000 mg | ORAL_TABLET | Freq: Three times a day (TID) | ORAL | 0 refills | Status: DC | PRN
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
## 2022-12-30 ENCOUNTER — Ambulatory Visit: Payer: BC Managed Care – PPO | Admitting: Physician Assistant

## 2022-12-30 ENCOUNTER — Encounter: Payer: Self-pay | Admitting: Physician Assistant

## 2022-12-30 DIAGNOSIS — M542 Cervicalgia: Secondary | ICD-10-CM

## 2022-12-30 DIAGNOSIS — M5010 Cervical disc disorder with radiculopathy, unspecified cervical region: Secondary | ICD-10-CM

## 2022-12-30 MED ORDER — METHYLPREDNISOLONE 4 MG PO TBPK: ORAL_TABLET | ORAL | 0 refills | Status: DC
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
## 2023-02-06 ENCOUNTER — Ambulatory Visit
Admission: RE | Admit: 2023-02-06 | Discharge: 2023-02-06 | Disposition: A | Discharge status: Home / Self Care | Payer: BC Managed Care – PPO | Source: Ambulatory Visit | Attending: Physician Assistant | Admitting: Physician Assistant

## 2023-02-06 DIAGNOSIS — M50122 Cervical disc disorder at C5-C6 level with radiculopathy: Secondary | ICD-10-CM | POA: Diagnosis not present

## 2023-02-06 DIAGNOSIS — M542 Cervicalgia: Secondary | ICD-10-CM

## 2023-02-06 DIAGNOSIS — M50222 Other cervical disc displacement at C5-C6 level: Secondary | ICD-10-CM | POA: Diagnosis not present

## 2023-02-06 DIAGNOSIS — G8929 Other chronic pain: Secondary | ICD-10-CM | POA: Diagnosis not present

## 2023-03-30 ENCOUNTER — Encounter: Payer: Self-pay | Admitting: Internal Medicine

## 2023-03-30 ENCOUNTER — Ambulatory Visit (INDEPENDENT_AMBULATORY_CARE_PROVIDER_SITE_OTHER): Payer: BC Managed Care – PPO | Admitting: Internal Medicine

## 2023-03-30 VITALS — BP 110/70 | HR 67 | Temp 98.1°F | Ht 66.0 in | Wt 143.2 lb

## 2023-03-30 DIAGNOSIS — Z0000 Encounter for general adult medical examination without abnormal findings: Secondary | ICD-10-CM | POA: Diagnosis not present

## 2023-03-30 NOTE — Progress Notes (Signed)
Subjective:   Jeremy Mccarthy 12/25/1990 03/30/2023  CC: Chief Complaint  Patient presents with   Annual Exam    HPI: Jeremy Mccarthy is a 32 y.o. male who presents for a routine health maintenance exam.  Labs collected at time of visit.   HEALTH SCREENINGS: - PSA (50+): Not applicable  No results found for: "PSA1", "PSA"   - Colonoscopy (45+): Not applicable  - AAA Screening: Not applicable  Men age 59-75 who have ever smoked - Lung Cancer screening with low-dose CT: Not applicable-  Adults age 89-80 who are current cigarette smokers or quit within the last 15 years. Must have 20 pack year history.   Depression and Anxiety Screen done today and results listed below:     03/30/2023    3:50 PM 12/23/2022    2:25 PM  Depression screen PHQ 2/9  Decreased Interest 0 1  Down, Depressed, Hopeless 0 2  PHQ - 2 Score 0 3  Altered sleeping 0 0  Tired, decreased energy 3 1  Change in appetite 2 0  Feeling bad or failure about yourself  0 1  Trouble concentrating 0 0  Moving slowly or fidgety/restless 0 0  Suicidal thoughts 0 0  PHQ-9 Score 5 5  Difficult doing work/chores Not difficult at all Somewhat difficult      03/30/2023    3:50 PM 12/23/2022    2:25 PM  GAD 7 : Generalized Anxiety Score  Nervous, Anxious, on Edge 0 0  Control/stop worrying 0 0  Worry too much - different things 0 3  Trouble relaxing 0 0  Restless 0 0  Easily annoyed or irritable 0 2  Afraid - awful might happen 0 0  Total GAD 7 Score 0 5  Anxiety Difficulty Not difficult at all Somewhat difficult    IMMUNIZATIONS:  - Tdap: Tetanus vaccination status reviewed: last tetanus booster within 10 years. - Influenza: Refused - Pneumovax: Not applicable - Prevnar: Not applicable - Zostavax vaccine (50+): Not applicable   Past medical history, surgical history, medications, allergies, family history and social history reviewed with patient today and changes made to appropriate areas of the  chart.   History reviewed. No pertinent past medical history.  History reviewed. No pertinent surgical history.  No current outpatient medications on file prior to visit.   No current facility-administered medications on file prior to visit.    No Known Allergies   Social History   Socioeconomic History   Marital status: Married    Spouse name: Not on file   Number of children: Not on file   Years of education: Not on file   Highest education level: Not on file  Occupational History   Not on file  Tobacco Use   Smoking status: Never   Smokeless tobacco: Never  Substance and Sexual Activity   Alcohol use: Never   Drug use: Never   Sexual activity: Yes  Other Topics Concern   Not on file  Social History Narrative   Not on file   Social Determinants of Health   Financial Resource Strain: Not on file  Food Insecurity: Not on file  Transportation Needs: Not on file  Physical Activity: Not on file  Stress: Not on file  Social Connections: Unknown (10/08/2021)   Received from [facility], [facility]   Social Network    Social Network: Not on file  Intimate Partner Violence: Unknown (08/30/2021)   Received from [facility], [facility]   HITS  Physically Hurt: Not on file    Insult or Talk Down To: Not on file    Threaten Physical Harm: Not on file    Scream or Curse: Not on file   Social History   Tobacco Use  Smoking Status Never  Smokeless Tobacco Never   Social History   Substance and Sexual Activity  Alcohol Use Never     Family History  Problem Relation Age of Onset   Diabetes Mother      ROS: Denies fever, fatigue, unexplained weight loss/gain, hearing or vision changes, cardiac or respiratory complaints. Denies neurological deficits, musculoskeletal complaints, gastrointestinal or genitourinary complaints, mental health complaints, and skin changes.   Objective:   Today's Vitals   03/30/23 1545  BP: 110/70  Pulse: 67  Temp:  98.1 F (36.7 C)  TempSrc: Temporal  SpO2: 99%  Weight: 143 lb 3.2 oz (65 kg)  Height: 5\' 6"  (1.676 m)    GENERAL APPEARANCE: Well-appearing, in NAD. Well nourished.  SKIN: Pink, warm and dry. Turgor normal. No rash, lesion, ulceration, or ecchymoses. Hair evenly distributed.  HEENT: HEAD: Normocephalic.  EYES: PERRLA. EOMI. Lids intact w/o defect. Sclera white, Conjunctiva pink w/o exudate.  EARS: External ear w/o redness, swelling, masses or lesions. EAC clear. TM's intact, translucent w/o bulging, appropriate landmarks visualized. Appropriate acuity to conversational tones.  NOSE: Septum midline w/o deformity. Nares patent, mucosa pink and non-inflamed w/o drainage. No sinus tenderness.  THROAT: Uvula midline. Oropharynx clear. Tonsils non-inflamed w/o exudate . Oral mucosa pink and moist.  NECK: Supple, Trachea midline. Full ROM w/o pain or tenderness. No lymphadenopathy. Thyroid non-tender w/o enlargement or palpable masses.  RESPIRATORY: Chest wall symmetrical w/o masses. Respirations even and non-labored. Breath sounds clear to auscultation bilaterally. No wheezes, rales, rhonchi, or crackles. CARDIAC: S1, S2 present, regular rate and rhythm. No gallops, murmurs, rubs, or clicks. No carotid bruits. Capillary refill <2 seconds. Peripheral pulses 2+ bilaterally. GI: Abdomen soft w/o distention. Normoactive bowel sounds. No palpable masses or tenderness. No guarding or rebound tenderness. Liver and spleen w/o tenderness or enlargement. No CVA tenderness.  GU:  deferred exam. MSK: Muscle tone and strength appropriate for age, w/o atrophy or abnormal movement. EXTREMITIES: Active ROM intact, w/o tenderness, crepitus, or contracture. No obvious joint deformities or effusions. No clubbing, edema, or cyanosis.  NEUROLOGIC: CN's II-XII intact. Motor strength symmetrical with no obvious weakness. No sensory deficits. Steady, even gait.  PSYCH/MENTAL STATUS: Alert, oriented x 3. Cooperative,  appropriate mood and affect.    Assessment & Plan:  Encounter for general adult medical examination without abnormal findings -     CBC with Differential/Platelet -     Comprehensive metabolic panel -     Lipid panel -     TSH     Orders Placed This Encounter  Procedures   CBC with Differential/Platelet   Comprehensive metabolic panel   Lipid panel   TSH    PATIENT COUNSELING: - Encouraged to adjust caloric intake to maintain or achieve ideal body weight, to reduce intake of dietary saturated fat and total fat, to limit sodium intake by avoiding high sodium foods and not adding table salt, and to maintain adequate dietary potassium and calcium preferably from fresh fruits, vegetables, and low-fat dairy products.   - Advised to avoid cigarette smoking. - Discussed with the patient that most people either abstain from alcohol or drink within safe limits (<=14/week and <=4 drinks/occasion for males, <=7/weeks and <= 3 drinks/occasion for females) and that the risk  for alcohol disorders and other health effects rises proportionally with the number of drinks per week and how often a drinker exceeds daily limits. - Discussed cessation/primary prevention of drug use and availability of treatment for abuse.   - Stressed the importance of regular exercise - Injury prevention: Discussed safety belts, safety helmets, smoke detector, smoking near bedding or upholstery.  - Dental health: Discussed importance of regular tooth brushing, flossing, and dental visits.  - Sexuality: Discussed sexually transmitted diseases, partner selection, use of condoms, avoidance of unintended pregnancy  and contraceptive alternatives.   NEXT PREVENTATIVE PHYSICAL DUE IN 1 YEAR.  Return in about 1 year (around 03/29/2024) for Fasting Annual Physical Exam.  Salvatore Decent, FNP

## 2023-03-31 ENCOUNTER — Telehealth: Payer: Self-pay

## 2023-03-31 LAB — COMPREHENSIVE METABOLIC PANEL
ALT: 14 U/L (ref 0–53)
AST: 21 U/L (ref 0–37)
Albumin: 4.6 g/dL (ref 3.5–5.2)
Alkaline Phosphatase: 76 U/L (ref 39–117)
BUN: 18 mg/dL (ref 6–23)
CO2: 30 meq/L (ref 19–32)
Calcium: 9.7 mg/dL (ref 8.4–10.5)
Chloride: 102 meq/L (ref 96–112)
Creatinine, Ser: 1.59 mg/dL — ABNORMAL HIGH (ref 0.40–1.50)
GFR: 57.55 mL/min — ABNORMAL LOW (ref >60.00)
Glucose, Bld: 43 mg/dL — CL (ref 70–99)
Potassium: 3.8 meq/L (ref 3.5–5.1)
Sodium: 140 meq/L (ref 135–145)
Total Bilirubin: 0.6 mg/dL (ref 0.2–1.2)
Total Protein: 7.9 g/dL (ref 6.0–8.3)

## 2023-03-31 LAB — CBC WITH DIFFERENTIAL/PLATELET
Basophils Absolute: 0.1 10*3/uL (ref 0.0–0.1)
Basophils Relative: 0.9 % (ref 0.0–3.0)
Eosinophils Absolute: 0.1 10*3/uL (ref 0.0–0.7)
Eosinophils Relative: 1.4 % (ref 0.0–5.0)
HCT: 47.2 % (ref 39.0–52.0)
Hemoglobin: 15.3 g/dL (ref 13.0–17.0)
Lymphocytes Relative: 39 % (ref 12.0–46.0)
Lymphs Abs: 2.6 10*3/uL (ref 0.7–4.0)
MCHC: 32.5 g/dL (ref 30.0–36.0)
MCV: 90.7 fL (ref 78.0–100.0)
Monocytes Absolute: 0.4 10*3/uL (ref 0.1–1.0)
Monocytes Relative: 6.3 % (ref 3.0–12.0)
Neutro Abs: 3.5 10*3/uL (ref 1.4–7.7)
Neutrophils Relative %: 52.4 % (ref 43.0–77.0)
Platelets: 278 10*3/uL (ref 150.0–400.0)
RBC: 5.2 Mil/uL (ref 4.22–5.81)
RDW: 13.4 % (ref 11.5–15.5)
WBC: 6.7 10*3/uL (ref 4.0–10.5)

## 2023-03-31 LAB — LIPID PANEL
Cholesterol: 267 mg/dL — ABNORMAL HIGH (ref 0–200)
HDL: 58.7 mg/dL (ref >39.00)
LDL Cholesterol: 198 mg/dL — ABNORMAL HIGH (ref 0–99)
NonHDL: 208.23
Total CHOL/HDL Ratio: 5
Triglycerides: 53 mg/dL (ref 0.0–149.0)
VLDL: 10.6 mg/dL (ref 0.0–40.0)

## 2023-03-31 LAB — TSH: TSH: 0.63 u[IU]/mL (ref 0.35–5.50)

## 2023-03-31 NOTE — Telephone Encounter (Addendum)
CRITICAL VALUE STICKER  CRITICAL VALUE: glucose 43  RECEIVER (on-site recipient of call): Arvil Persons  DATE & TIME NOTIFIED: 03/31/23 @ 1119  MESSENGER (representative from lab): Sharin Mons  MD NOTIFIED: A. Myers  TIME OF NOTIFICATION: 1123  RESPONSE:  Provider notified for review. Provider aware. Pt was here for physical late yesterday afternoon and had not eaten all day.

## 2023-03-31 NOTE — Telephone Encounter (Signed)
Provider aware

## 2023-04-01 ENCOUNTER — Other Ambulatory Visit: Payer: Self-pay | Admitting: Internal Medicine

## 2023-04-01 DIAGNOSIS — R7989 Other specified abnormal findings of blood chemistry: Secondary | ICD-10-CM

## 2023-04-17 ENCOUNTER — Other Ambulatory Visit: Payer: BC Managed Care – PPO

## 2023-06-24 DIAGNOSIS — R509 Fever, unspecified: Secondary | ICD-10-CM | POA: Diagnosis not present

## 2023-06-24 DIAGNOSIS — R059 Cough, unspecified: Secondary | ICD-10-CM | POA: Diagnosis not present

## 2023-06-24 DIAGNOSIS — M791 Myalgia, unspecified site: Secondary | ICD-10-CM | POA: Diagnosis not present

## 2023-06-24 DIAGNOSIS — J101 Influenza due to other identified influenza virus with other respiratory manifestations: Secondary | ICD-10-CM | POA: Diagnosis not present

## 2023-09-30 ENCOUNTER — Ambulatory Visit: Payer: BC Managed Care – PPO | Admitting: Internal Medicine

## 2023-10-07 NOTE — Progress Notes (Deleted)
  [facility] [address] [place] [address] Dept: [phone] Dept Fax: [phone]    Subjective:   Jeremy Mccarthy 20-Apr-1991 10/08/2023  No chief complaint on file.   HPI: Jeremy Mccarthy presents today for re-assessment and management of chronic medical conditions.        The following portions of the patient's history were reviewed and updated as appropriate: past medical history, past surgical history, family history, social history, allergies, medications, and problem list.   Patient Active Problem List   Diagnosis Date Noted   Cervical disc disorder with radiculopathy, unspecified cervical region 12/30/2022   No past medical history on file. No past surgical history on file. Family History  Problem Relation Age of Onset   Diabetes Mother    No current outpatient medications on file. No Known Allergies   ROS: A complete ROS was performed with pertinent positives/negatives noted in the HPI. The remainder of the ROS are negative.    Objective:   There were no vitals filed for this visit.  GENERAL: Well-appearing, in NAD. Well nourished.  SKIN: Pink, warm and dry. No rash, lesion, ulceration, or ecchymoses.  NECK: Trachea midline. Full ROM w/o pain or tenderness. No lymphadenopathy.  RESPIRATORY: Chest wall symmetrical. Respirations even and non-labored. Breath sounds clear to auscultation bilaterally.  CARDIAC: S1, S2 present, regular rate and rhythm. Peripheral pulses 2+ bilaterally.  EXTREMITIES: Without clubbing, cyanosis, or edema.  NEUROLOGIC: No motor or sensory deficits. Steady, even gait.  PSYCH/MENTAL STATUS: Alert, oriented x 3. Cooperative, appropriate mood and affect.   Health Maintenance Due  Topic Date Due   HIV Screening  Never done   Hepatitis C Screening  Never done   COVID-19 Vaccine (1 - 2024-25 season) Never done    No results found for any visits on 10/08/23.  The  ASCVD Risk score (Arnett DK, et al., 2019) failed to calculate for the following reasons:   The 2019 ASCVD risk score is only valid for ages 70 to 45     Assessment & Plan:    No orders of the defined types were placed in this encounter.  No images are attached to the encounter or orders placed in the encounter. No orders of the defined types were placed in this encounter.   No follow-ups on file.   Gavin Kast, FNP

## 2023-10-08 ENCOUNTER — Ambulatory Visit: Admitting: Internal Medicine

## 2023-10-08 DIAGNOSIS — R7989 Other specified abnormal findings of blood chemistry: Secondary | ICD-10-CM

## 2023-10-08 DIAGNOSIS — E785 Hyperlipidemia, unspecified: Secondary | ICD-10-CM

## 2023-10-23 ENCOUNTER — Telehealth: Payer: Self-pay | Admitting: Internal Medicine

## 2023-10-23 NOTE — Telephone Encounter (Signed)
 09/30/2023 same day cancel 10/08/2023 no show  Final warning sent via mail and mychart

## 2023-10-26 NOTE — Telephone Encounter (Signed)
 Provider aware

## 2024-03-31 NOTE — Progress Notes (Unsigned)
 Subjective:   Jeremy Mccarthy December 20, 1990 04/01/2024  CC: No chief complaint on file.   HPI: Jeremy Mccarthy is a 33 y.o. male who presents for a routine health maintenance exam.  Labs collected at time of visit.   HEALTH SCREENINGS: - PSA (50+): Not applicable  No results found for: PSA1, PSA   - Colonoscopy (45+): Not applicable  - AAA Screening: Not applicable  Men age 37-75 who have ever smoked - Lung Cancer screening with low-dose CT: Not applicable-  Adults age 62-80 who are current cigarette smokers or quit within the last 15 years. Must have 20 pack year history.   IMMUNIZATIONS:  - Tdap: Tetanus vaccination status reviewed: last tetanus booster within 10 years. - Influenza: {Blank single:19197::Up to date,Administered today,Postponed to flu season,Refused,Given elsewhere}    Past medical history, surgical history, medications, allergies, family history and social history reviewed with patient today and changes made to appropriate areas of the chart.   Social History   Socioeconomic History   Marital status: Married    Spouse name: Not on file   Number of children: Not on file   Years of education: Not on file   Highest education level: Not on file  Occupational History   Not on file  Tobacco Use   Smoking status: Never   Smokeless tobacco: Never  Substance and Sexual Activity   Alcohol use: Never   Drug use: Never   Sexual activity: Yes  Other Topics Concern   Not on file  Social History Narrative   Not on file   Social Drivers of Health   Financial Resource Strain: Not on file  Food Insecurity: Not on file  Transportation Needs: Not on file  Physical Activity: Not on file  Stress: Not on file  Social Connections: Unknown (10/08/2021)   Received from [facility]   Social Network    Social Network: Not on file  Intimate Partner Violence: Not At Risk (06/24/2023)   Received from [facility]   HITS    Over the last 12  months how often did your partner physically hurt you?: Never    Over the last 12 months how often did your partner insult you or talk down to you?: Never    Over the last 12 months how often did your partner threaten you with physical harm?: Never    Over the last 12 months how often did your partner scream or curse at you?: Never    No past medical history on file.  No past surgical history on file.  No current outpatient medications on file prior to visit.   No current facility-administered medications on file prior to visit.    No Known Allergies  Family History  Problem Relation Age of Onset   Diabetes Mother      ROS: Denies fever, fatigue, unexplained weight loss/gain, hearing or vision changes, cardiac or respiratory complaints. Denies neurological deficits, musculoskeletal complaints, gastrointestinal or genitourinary complaints, mental health complaints, and skin changes.   Objective:   There were no vitals filed for this visit.  GENERAL APPEARANCE: Well-appearing, in NAD. Well nourished.  SKIN: Pink, warm and dry. Turgor normal. No rash, lesion, ulceration, or ecchymoses. Hair evenly distributed.  HEENT: HEAD: Normocephalic.  EYES: PERRLA. EOMI. Lids intact w/o defect. Sclera white, Conjunctiva pink w/o exudate.  EARS: External ear w/o redness, swelling, masses or lesions. EAC clear. TM's intact, translucent w/o bulging, appropriate landmarks visualized. Appropriate acuity to conversational tones.  NOSE: Septum midline w/o  deformity. Nares patent, mucosa pink and non-inflamed w/o drainage.  THROAT: Uvula midline. Oropharynx clear. Tonsils *** non-inflamed w/o exudate. Oral mucosa pink and moist.  NECK: Supple, Trachea midline. Full ROM w/o pain or tenderness. No lymphadenopathy. Thyroid non-tender w/o enlargement or palpable masses.  RESPIRATORY: Chest wall symmetrical w/o masses. Respirations even and non-labored. Breath sounds clear to auscultation bilaterally. No  wheezes, rales, rhonchi, or crackles. CARDIAC: S1, S2 present, regular rate and rhythm. No gallops, murmurs, rubs, or clicks. Capillary refill <2 seconds. Peripheral pulses 2+ bilaterally. GI: Abdomen soft w/o distention. Normoactive bowel sounds. No palpable masses or tenderness. No guarding or rebound tenderness. Liver and spleen w/o tenderness or enlargement. No CVA tenderness.  GU:  deferred exam. MSK: Muscle tone and strength appropriate for age, w/o atrophy or abnormal movement. EXTREMITIES: Active ROM intact, w/o tenderness, crepitus, or contracture. No obvious joint deformities or effusions. No clubbing, edema, or cyanosis.  NEUROLOGIC: CN's II-XII intact. Motor strength symmetrical with no obvious weakness. No sensory deficits.  Steady, even gait.  PSYCH/MENTAL STATUS: Alert, oriented x 3. Cooperative, appropriate mood and affect.     Depression and Anxiety Screen done today and results listed below:     03/30/2023    3:50 PM 12/23/2022    2:25 PM  Depression screen PHQ 2/9  Decreased Interest 0 1  Down, Depressed, Hopeless 0 2  PHQ - 2 Score 0 3  Altered sleeping 0 0  Tired, decreased energy 3 1  Change in appetite 2 0  Feeling bad or failure about yourself  0 1  Trouble concentrating 0 0  Moving slowly or fidgety/restless 0 0  Suicidal thoughts 0 0  PHQ-9 Score 5  5   Difficult doing work/chores Not difficult at all Somewhat difficult     Data saved with a previous flowsheet row definition      03/30/2023    3:50 PM 12/23/2022    2:25 PM  GAD 7 : Generalized Anxiety Score  Nervous, Anxious, on Edge 0 0  Control/stop worrying 0 0  Worry too much - different things 0 3  Trouble relaxing 0 0  Restless 0 0  Easily annoyed or irritable 0 2  Afraid - awful might happen 0 0  Total GAD 7 Score 0 5  Anxiety Difficulty Not difficult at all Somewhat difficult    Assessment & Plan:  There are no diagnoses linked to this encounter.   No orders of the defined types were  placed in this encounter.   PATIENT COUNSELING: - Encourage to adjust caloric intake to maintain or achieve ideal body weight, to reduce intake of dietary saturated fat and total fat, to limit sodium intake by avoiding high sodium foods and not adding table salt, and to maintain adequate dietary potassium and calcium preferably from fresh fruits, vegetables, and low-fat dairy products.   - Stress the importance of regular exercise - Avoid cigarette smoking. - Discussed with the patient that most people either abstain from alcohol or drink within safe limits (<=14/week and <=4 drinks/occasion for males, <=7/weeks and <= 3 drinks/occasion for females) and that the risk for alcohol disorders and other health effects rises proportionally with the number of drinks per week and how often a drinker exceeds daily limits. - Discussed cessation/primary prevention of drug use and availability of treatment for abuse.  - Sexuality: Discussed sexually transmitted diseases, partner selection, use of condoms, avoidance of unintended pregnancy  and contraceptive alternatives.  - Injury prevention: Discussed safety belts, safety helmets, smoke  detector, smoking near bedding or upholstery.  - Dental health: Discussed importance of regular tooth brushing, flossing, and dental visits.   NEXT PREVENTATIVE PHYSICAL DUE IN 1 YEAR.  No follow-ups on file.  Rosina Senters, FNP

## 2024-04-01 ENCOUNTER — Ambulatory Visit (INDEPENDENT_AMBULATORY_CARE_PROVIDER_SITE_OTHER): Payer: BC Managed Care – PPO | Admitting: Internal Medicine

## 2024-04-01 VITALS — BP 112/72 | HR 65 | Temp 98.7°F | Ht 66.0 in | Wt 155.2 lb

## 2024-04-01 DIAGNOSIS — Z23 Encounter for immunization: Secondary | ICD-10-CM

## 2024-04-01 DIAGNOSIS — Z114 Encounter for screening for human immunodeficiency virus [HIV]: Secondary | ICD-10-CM | POA: Diagnosis not present

## 2024-04-01 DIAGNOSIS — Z0000 Encounter for general adult medical examination without abnormal findings: Secondary | ICD-10-CM | POA: Diagnosis not present

## 2024-04-01 DIAGNOSIS — Z1159 Encounter for screening for other viral diseases: Secondary | ICD-10-CM | POA: Diagnosis not present

## 2024-04-01 LAB — CBC WITH DIFFERENTIAL/PLATELET
Basophils Absolute: 0 K/uL (ref 0.0–0.1)
Basophils Relative: 0.4 % (ref 0.0–3.0)
Eosinophils Absolute: 0.1 K/uL (ref 0.0–0.7)
Eosinophils Relative: 0.6 % (ref 0.0–5.0)
HCT: 46.4 % (ref 39.0–52.0)
Hemoglobin: 15.5 g/dL (ref 13.0–17.0)
Lymphocytes Relative: 26.4 % (ref 12.0–46.0)
Lymphs Abs: 2.4 K/uL (ref 0.7–4.0)
MCHC: 33.4 g/dL (ref 30.0–36.0)
MCV: 88.1 fl (ref 78.0–100.0)
Monocytes Absolute: 0.4 K/uL (ref 0.1–1.0)
Monocytes Relative: 5 % (ref 3.0–12.0)
Neutro Abs: 6.1 K/uL (ref 1.4–7.7)
Neutrophils Relative %: 67.6 % (ref 43.0–77.0)
Platelets: 271 K/uL (ref 150.0–400.0)
RBC: 5.27 Mil/uL (ref 4.22–5.81)
RDW: 13.6 % (ref 11.5–15.5)
WBC: 9 K/uL (ref 4.0–10.5)

## 2024-04-01 LAB — COMPREHENSIVE METABOLIC PANEL WITH GFR
ALT: 20 U/L (ref 0–53)
AST: 24 U/L (ref 0–37)
Albumin: 4.5 g/dL (ref 3.5–5.2)
Alkaline Phosphatase: 78 U/L (ref 39–117)
BUN: 15 mg/dL (ref 6–23)
CO2: 29 meq/L (ref 19–32)
Calcium: 9.5 mg/dL (ref 8.4–10.5)
Chloride: 102 meq/L (ref 96–112)
Creatinine, Ser: 1.62 mg/dL — ABNORMAL HIGH (ref 0.40–1.50)
GFR: 55.88 mL/min — ABNORMAL LOW (ref >60.00)
Glucose, Bld: 88 mg/dL (ref 70–99)
Potassium: 3.9 meq/L (ref 3.5–5.1)
Sodium: 140 meq/L (ref 135–145)
Total Bilirubin: 0.4 mg/dL (ref 0.2–1.2)
Total Protein: 7.9 g/dL (ref 6.0–8.3)

## 2024-04-01 LAB — LIPID PANEL
Cholesterol: 239 mg/dL — ABNORMAL HIGH (ref 0–200)
HDL: 48 mg/dL (ref >39.00)
LDL Cholesterol: 174 mg/dL — ABNORMAL HIGH (ref 0–99)
NonHDL: 190.57
Total CHOL/HDL Ratio: 5
Triglycerides: 81 mg/dL (ref 0.0–149.0)
VLDL: 16.2 mg/dL (ref 0.0–40.0)

## 2024-04-01 LAB — TSH: TSH: 0.63 u[IU]/mL (ref 0.35–5.50)

## 2024-04-02 LAB — HEPATITIS C ANTIBODY: Hepatitis C Ab: NONREACTIVE

## 2024-04-02 LAB — HIV ANTIBODY (ROUTINE TESTING W REFLEX)
HIV 1&2 Ab, 4th Generation: NONREACTIVE
HIV FINAL INTERPRETATION: NEGATIVE

## 2024-04-06 ENCOUNTER — Ambulatory Visit: Payer: Self-pay | Admitting: Internal Medicine

## 2024-04-06 DIAGNOSIS — E7800 Pure hypercholesterolemia, unspecified: Secondary | ICD-10-CM | POA: Insufficient documentation

## 2024-04-06 DIAGNOSIS — N1831 Chronic kidney disease, stage 3a: Secondary | ICD-10-CM | POA: Insufficient documentation

## 2024-04-06 MED ORDER — ATORVASTATIN CALCIUM 10 MG PO TABS: 10.0000 mg | ORAL_TABLET | Freq: Every day | ORAL | 1 refills | Status: AC

## 2024-04-12 NOTE — Telephone Encounter (Signed)
 LVM for pt to call office to make OV for 3 month follow up for fasting labs.

## 2024-06-06 LAB — LAB REPORT - SCANNED
Albumin, Urine POC: 4.8
HM HIV Screening: NEGATIVE
HM Hepatitis Screen: NEGATIVE
Microalb Creat Ratio: 1
PTH: 38

## 2024-06-14 ENCOUNTER — Other Ambulatory Visit: Payer: Self-pay | Admitting: Nephrology

## 2024-06-14 ENCOUNTER — Encounter: Payer: Self-pay | Admitting: Nephrology

## 2024-06-14 DIAGNOSIS — N1831 Chronic kidney disease, stage 3a: Secondary | ICD-10-CM

## 2024-06-16 ENCOUNTER — Ambulatory Visit
Admission: RE | Admit: 2024-06-16 | Discharge: 2024-06-16 | Disposition: A | Discharge status: Home / Self Care | Source: Ambulatory Visit | Attending: Nephrology | Admitting: Nephrology

## 2024-06-16 DIAGNOSIS — N1831 Chronic kidney disease, stage 3a: Secondary | ICD-10-CM

## 2025-04-07 ENCOUNTER — Encounter: Admitting: Internal Medicine
